# Patient Record
Sex: Male | Born: 1990 | Race: White | Hispanic: No | Marital: Single | Smoking: Never smoker
Health system: Southern US, Community
[De-identification: ages and names within clinical notes are randomized; demographics above are authoritative.]

## PROBLEM LIST (undated history)

## (undated) DIAGNOSIS — R51 Headache: Secondary | ICD-10-CM

## (undated) DIAGNOSIS — K589 Irritable bowel syndrome without diarrhea: Secondary | ICD-10-CM

## (undated) DIAGNOSIS — R519 Headache, unspecified: Secondary | ICD-10-CM

## (undated) DIAGNOSIS — F329 Major depressive disorder, single episode, unspecified: Secondary | ICD-10-CM

## (undated) DIAGNOSIS — F419 Anxiety disorder, unspecified: Secondary | ICD-10-CM

## (undated) DIAGNOSIS — F32A Depression, unspecified: Secondary | ICD-10-CM

## (undated) DIAGNOSIS — J309 Allergic rhinitis, unspecified: Secondary | ICD-10-CM

## (undated) HISTORY — DX: Major depressive disorder, single episode, unspecified: F32.9

## (undated) HISTORY — PX: WISDOM TOOTH EXTRACTION: SHX21

## (undated) HISTORY — DX: Allergic rhinitis, unspecified: J30.9

## (undated) HISTORY — PX: NASAL SINUS SURGERY: SHX719

## (undated) HISTORY — PX: TONSILLECTOMY: SUR1361

## (undated) HISTORY — PX: KNEE SURGERY: SHX244

## (undated) HISTORY — DX: Headache, unspecified: R51.9

## (undated) HISTORY — DX: Anxiety disorder, unspecified: F41.9

## (undated) HISTORY — DX: Headache: R51

## (undated) HISTORY — DX: Depression, unspecified: F32.A

## (undated) HISTORY — DX: Irritable bowel syndrome, unspecified: K58.9

---
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
## 2013-09-08 DIAGNOSIS — M7700 Medial epicondylitis, unspecified elbow: Secondary | ICD-10-CM | POA: Insufficient documentation

## 2013-09-08 DIAGNOSIS — M659 Synovitis and tenosynovitis, unspecified: Secondary | ICD-10-CM | POA: Insufficient documentation

## 2014-01-08 ENCOUNTER — Emergency Department (HOSPITAL_COMMUNITY)
Admission: EM | Admit: 2014-01-08 | Discharge: 2014-01-08 | Disposition: A | Discharge status: Home / Self Care | Payer: BC Managed Care – PPO | Attending: Emergency Medicine | Admitting: Emergency Medicine

## 2014-01-08 ENCOUNTER — Emergency Department (HOSPITAL_COMMUNITY): Payer: BC Managed Care – PPO

## 2014-01-08 ENCOUNTER — Encounter (HOSPITAL_COMMUNITY): Payer: Self-pay | Admitting: Radiology

## 2014-01-08 DIAGNOSIS — W1789XA Other fall from one level to another, initial encounter: Secondary | ICD-10-CM | POA: Diagnosis not present

## 2014-01-08 DIAGNOSIS — T1490XA Injury, unspecified, initial encounter: Secondary | ICD-10-CM

## 2014-01-08 DIAGNOSIS — Y998 Other external cause status: Secondary | ICD-10-CM | POA: Insufficient documentation

## 2014-01-08 DIAGNOSIS — W19XXXA Unspecified fall, initial encounter: Secondary | ICD-10-CM

## 2014-01-08 DIAGNOSIS — S22009A Unspecified fracture of unspecified thoracic vertebra, initial encounter for closed fracture: Secondary | ICD-10-CM

## 2014-01-08 DIAGNOSIS — S32010A Wedge compression fracture of first lumbar vertebra, initial encounter for closed fracture: Secondary | ICD-10-CM | POA: Diagnosis not present

## 2014-01-08 DIAGNOSIS — Y9289 Other specified places as the place of occurrence of the external cause: Secondary | ICD-10-CM | POA: Insufficient documentation

## 2014-01-08 DIAGNOSIS — S3992XA Unspecified injury of lower back, initial encounter: Secondary | ICD-10-CM | POA: Diagnosis present

## 2014-01-08 DIAGNOSIS — Y9339 Activity, other involving climbing, rappelling and jumping off: Secondary | ICD-10-CM | POA: Insufficient documentation

## 2014-01-08 DIAGNOSIS — Z79899 Other long term (current) drug therapy: Secondary | ICD-10-CM | POA: Insufficient documentation

## 2014-01-08 DIAGNOSIS — S22080A Wedge compression fracture of T11-T12 vertebra, initial encounter for closed fracture: Secondary | ICD-10-CM | POA: Insufficient documentation

## 2014-01-08 DIAGNOSIS — S32009A Unspecified fracture of unspecified lumbar vertebra, initial encounter for closed fracture: Secondary | ICD-10-CM

## 2014-01-08 LAB — CBC
HEMATOCRIT: 44 % (ref 39.0–52.0)
HEMOGLOBIN: 15.3 g/dL (ref 13.0–17.0)
MCH: 28.3 pg (ref 26.0–34.0)
MCHC: 34.8 g/dL (ref 30.0–36.0)
MCV: 81.3 fL (ref 78.0–100.0)
PLATELETS: 249 10*3/uL (ref 150–400)
RBC: 5.41 MIL/uL (ref 4.22–5.81)
RDW: 12.9 % (ref 11.5–15.5)
WBC: 6.2 10*3/uL (ref 4.0–10.5)

## 2014-01-08 LAB — COMPREHENSIVE METABOLIC PANEL
ALK PHOS: 77 U/L (ref 39–117)
ALT: 26 U/L (ref 0–53)
ANION GAP: 7 (ref 5–15)
AST: 41 U/L — AB (ref 0–37)
Albumin: 4.4 g/dL (ref 3.5–5.2)
BILIRUBIN TOTAL: 0.9 mg/dL (ref 0.3–1.2)
BUN: 13 mg/dL (ref 6–23)
CALCIUM: 10.1 mg/dL (ref 8.4–10.5)
CO2: 27 mmol/L (ref 19–32)
Chloride: 104 mEq/L (ref 96–112)
Creatinine, Ser: 1.23 mg/dL (ref 0.50–1.35)
GFR, EST NON AFRICAN AMERICAN: 82 mL/min — AB (ref >90)
Glucose, Bld: 102 mg/dL — ABNORMAL HIGH (ref 70–99)
POTASSIUM: 4 mmol/L (ref 3.5–5.1)
Sodium: 138 mmol/L (ref 135–145)
Total Protein: 7.1 g/dL (ref 6.0–8.3)

## 2014-01-08 LAB — PROTIME-INR
INR: 1.06 (ref 0.00–1.49)
Prothrombin Time: 13.9 seconds (ref 11.6–15.2)

## 2014-01-08 LAB — ETHANOL: Alcohol, Ethyl (B): <5 mg/dL (ref 0–9)

## 2014-01-08 LAB — CDS SEROLOGY

## 2014-01-08 LAB — SAMPLE TO BLOOD BANK

## 2014-01-08 IMAGING — CT CT T SPINE W/O CM
2 of 3 series · 11 of 33 positions shown, 13 images · non-contrast
Comparison: none

CLINICAL DATA: Fell 20 feet while rock climbing.

EXAM:
CT ABDOMEN AND PELVIS WITHOUT CONTRAST, CT THORACIC SPINE, CT LUMBAR
SPINE.
TECHNIQUE: Multidetector CT imaging of the abdomen and pelvis was performed
following the standard protocol without IV contrast. CT thoracic and
lumbar spines were generated from the abdominal/pelvic imaging.

[Series 3: t-spine 2.0 i30s 3 · axial · 0.26mm/px · z∈[+988,+1282]mm · 8 of 175 slices shown, 10 images]
[im 14/175  soft-tissue]
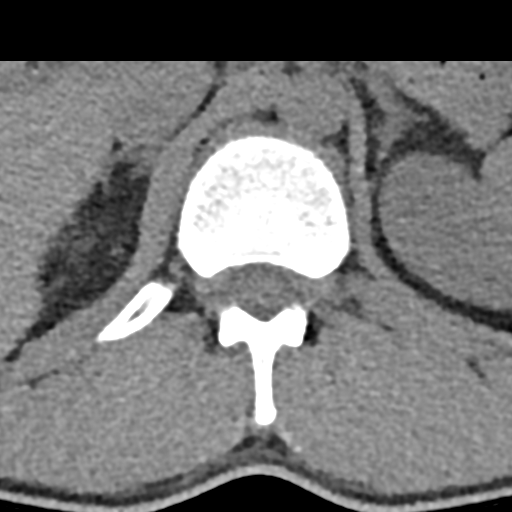
[im 14/175  bone]
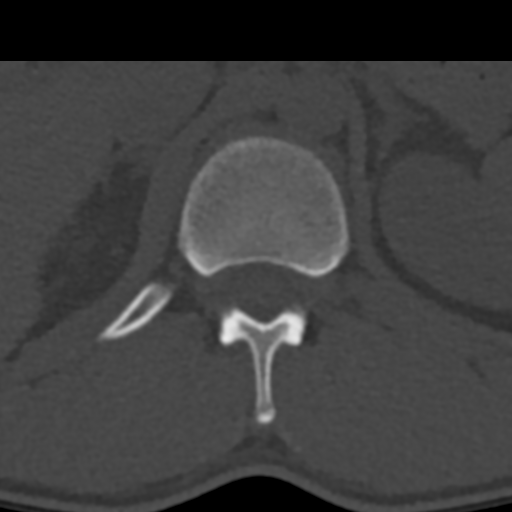
[im 41/175  bone]
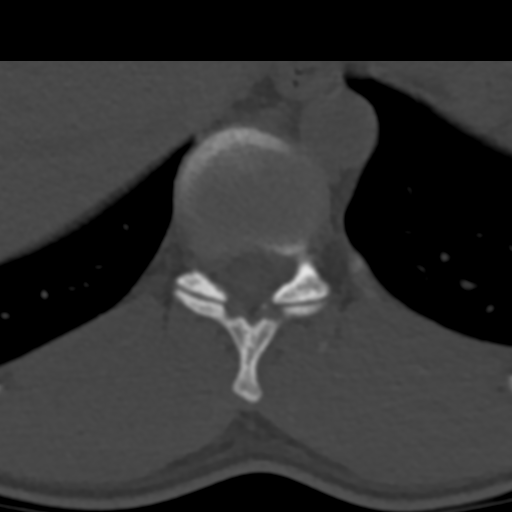
[im 54/175  bone]
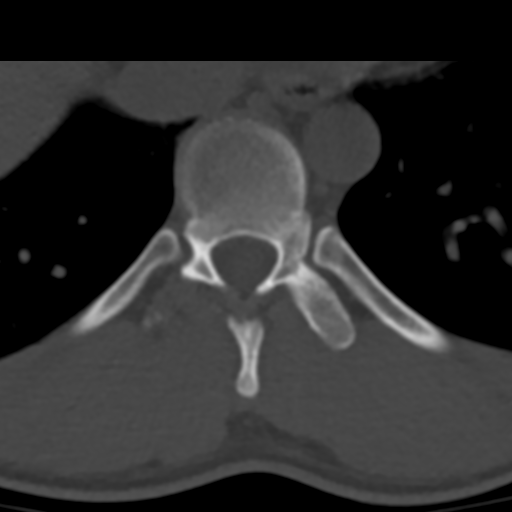
[im 81/175  bone]
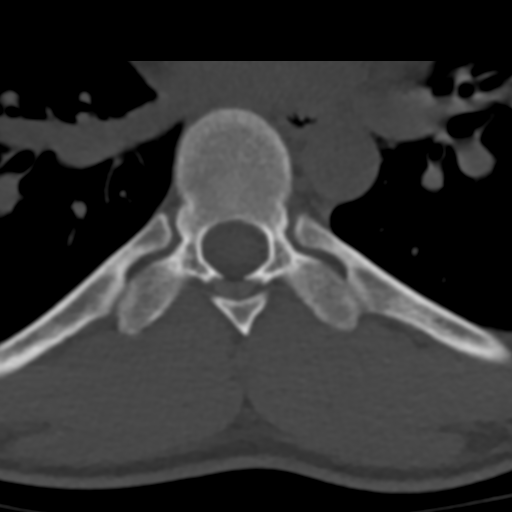
[im 94/175  soft-tissue]
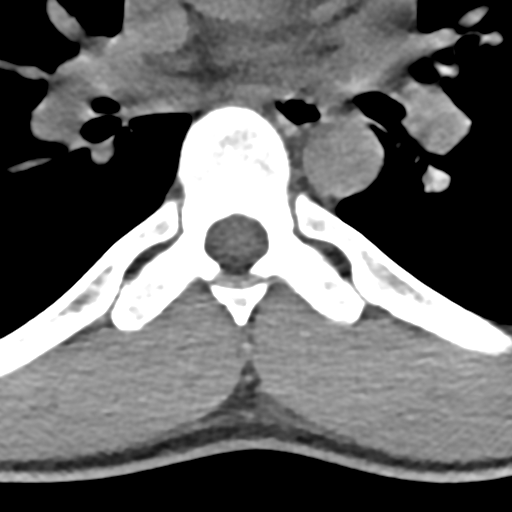
[im 94/175  bone]
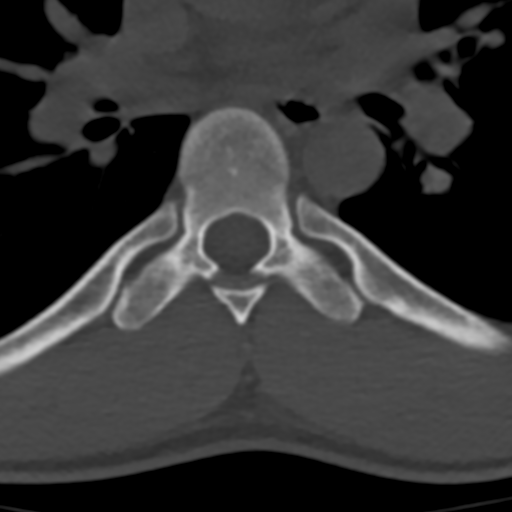
[im 121/175  bone]
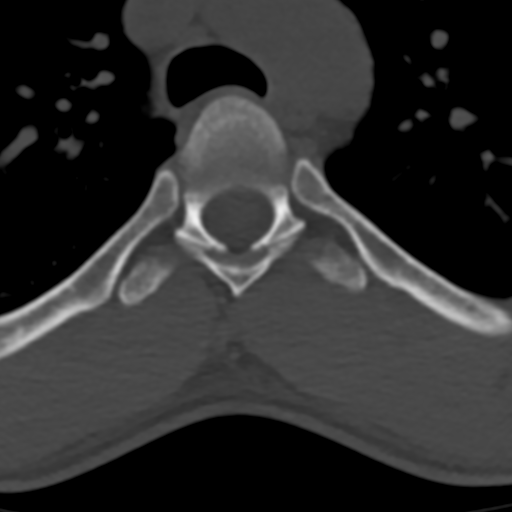
[im 134/175  bone]
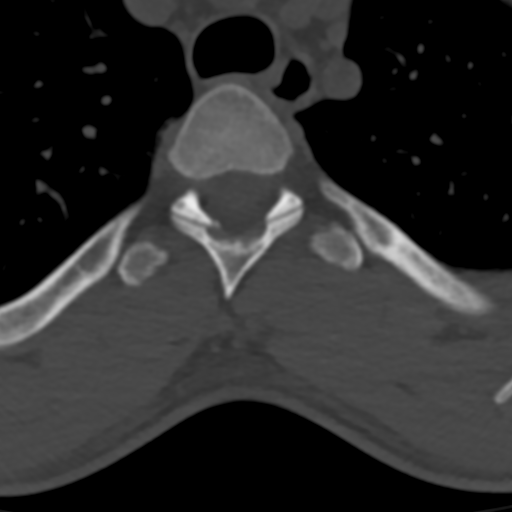
[im 161/175  bone]
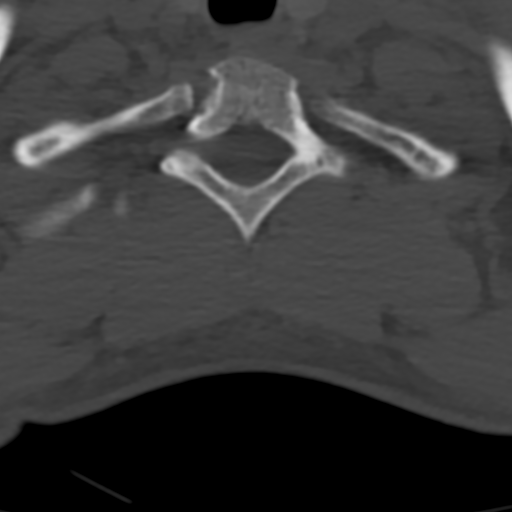

[Series 6: coronal st · coronal · 0.25mm/px · 3 of 67 slices shown]
[im 14/67  bone]
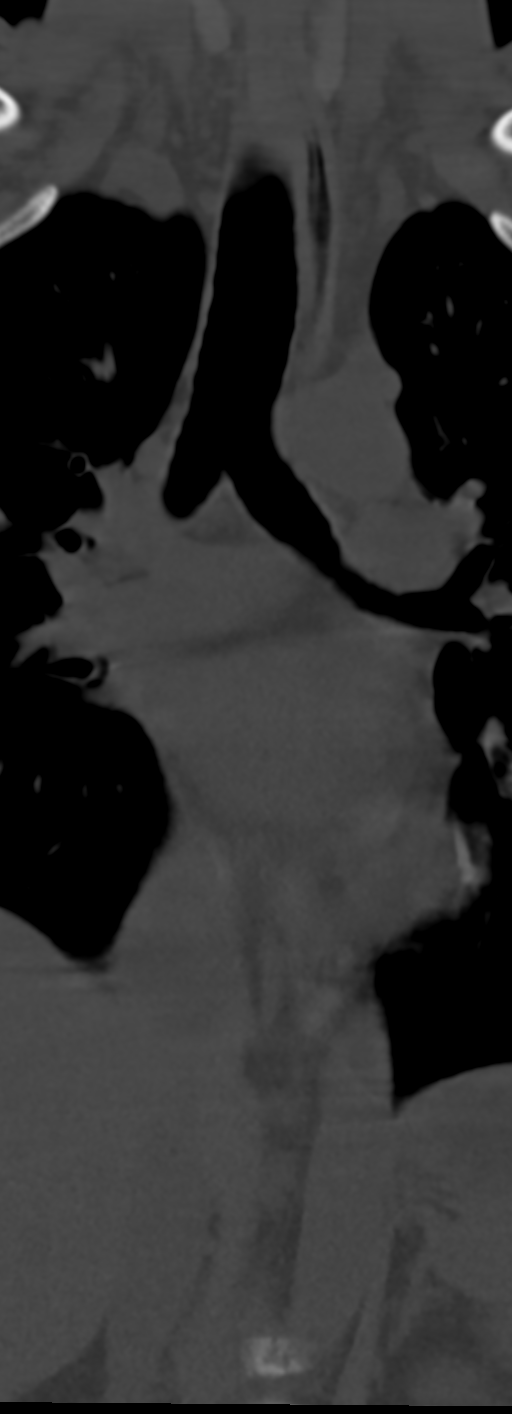
[im 27/67  bone]
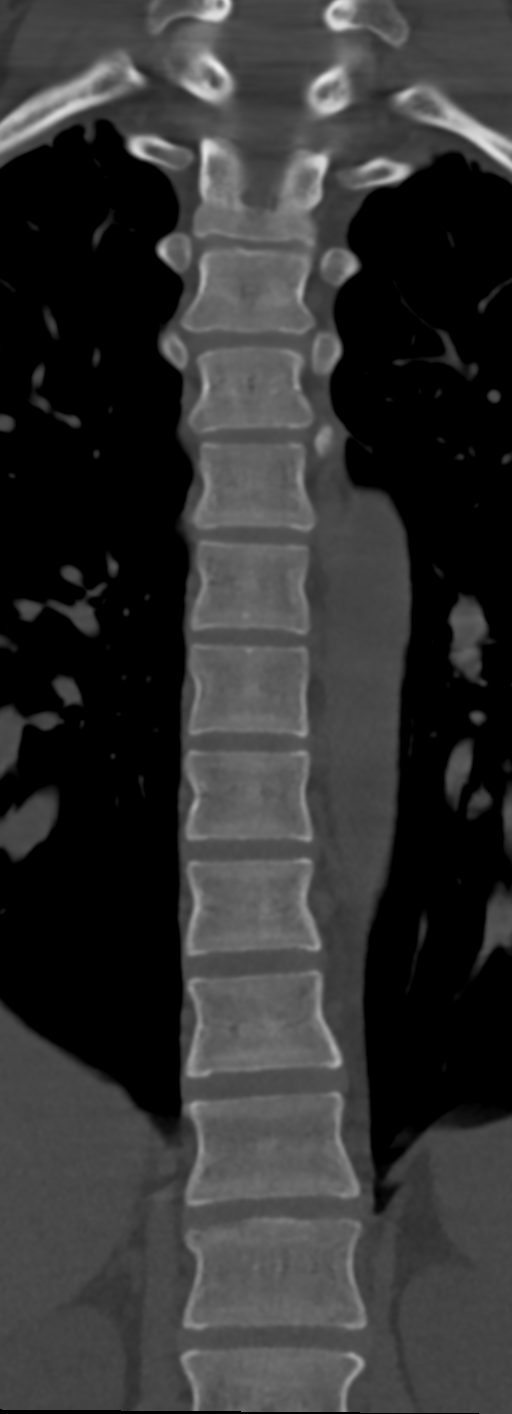
[im 40/67  bone]
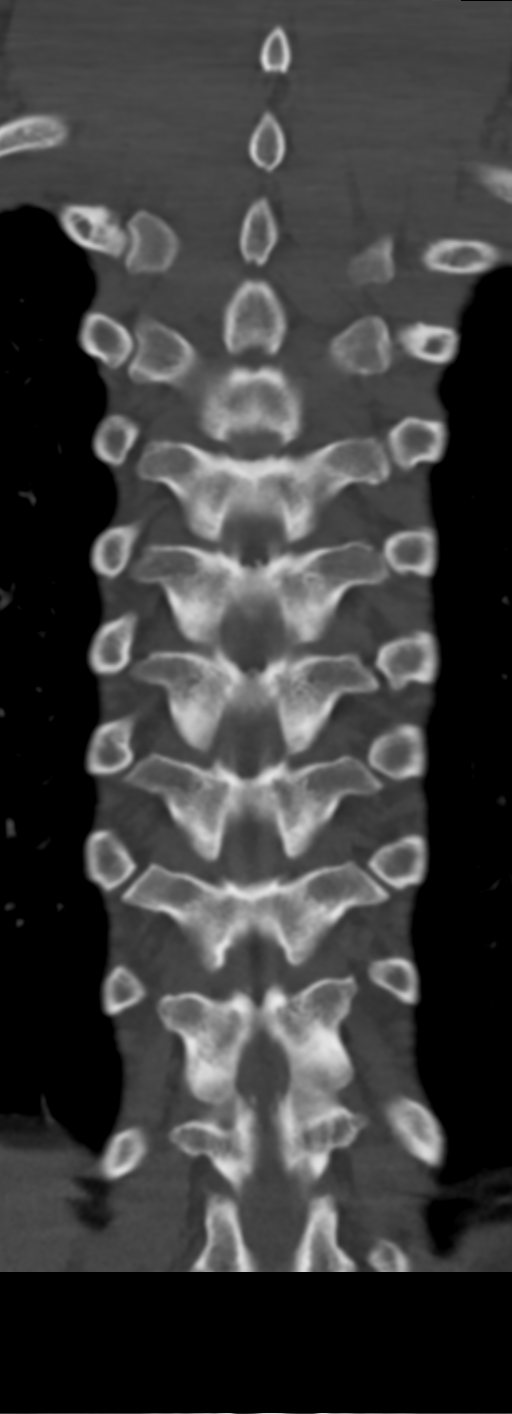

[11 of 33 positions shown; findings below may reference images not displayed]

FINDINGS: CT abdomen:

The lung bases are clear. No pleural effusion. No pneumothorax. The
lower ribs are intact.

The solid abdominal organs are intact. No acute injury. The
gallbladder is normal. No common bile duct dilatation.

The stomach, duodenum, small bowel and colon are grossly normal
without oral contrast. No inflammatory changes, mass lesions or
obstructive findings.

The aorta and branch vessels are normal. No mesenteric or
retroperitoneal mass, adenopathy or hematoma. The major venous
structures are patent. A circumaortic left renal vein is noted.

Pelvis: The bladder, prostate gland and seminal vesicles are normal.
No pelvic mass, adenopathy or hematoma. No inguinal mass or
hematoma.

The bony pelvis is intact. The pubic symphysis and SI joints appear
normal. Both hips are normally located.

CT thoracic spine:

Normal alignment of the thoracic vertebral bodies. The facets are
normally aligned. The spinal canal is patent. There is a compression
fracture of the T12 vertebral body. No retropulsion or canal
compromise. The pedicles and posterior elements are intact.

CT lumbar spine:

There is a compression fracture of the L1 vertebral body with mild
compression but no retropulsion or canal compromise. The pedicles
and posterior elements are intact. The lumbar vertebral bodies are
normally aligned. No other fractures. The spinous processes are
intact.
IMPRESSION: 1. Anterior wedge like compression fractures involving T12 and L1
but no retropulsion, canal compromise or posterior element
involvement.
2. No significant intra abdominal/pelvic findings. The bony pelvis
is intact.

## 2014-01-08 MED ORDER — HYDROMORPHONE HCL 1 MG/ML IJ SOLN
1.0000 mg | Freq: Once | INTRAMUSCULAR | Status: AC
  Administered 2014-01-08: 1 mg via INTRAVENOUS
  Filled 2014-01-08: qty 1

## 2014-01-08 MED ORDER — LORAZEPAM 2 MG/ML IJ SOLN
1.0000 mg | Freq: Once | INTRAMUSCULAR | Status: AC
  Administered 2014-01-08: 1 mg via INTRAVENOUS
  Filled 2014-01-08: qty 1

## 2014-01-08 MED ORDER — IOHEXOL 350 MG/ML SOLN: 100.0000 mL | Freq: Once | INTRAVENOUS | Status: DC | PRN

## 2014-01-08 MED ORDER — HYDROMORPHONE HCL 1 MG/ML IJ SOLN
1.0000 mg | Freq: Once | INTRAMUSCULAR | Status: AC
Start: 2014-01-08 — End: 2014-01-08
  Administered 2014-01-08: 1 mg via INTRAVENOUS
  Filled 2014-01-08: qty 1

## 2014-01-08 MED ORDER — OXYCODONE-ACETAMINOPHEN 5-325 MG PO TABS: 1.0000 | ORAL_TABLET | ORAL | Status: DC | PRN

## 2014-01-08 MED ORDER — IOHEXOL 300 MG/ML  SOLN
100.0000 mL | Freq: Once | INTRAMUSCULAR | Status: AC | PRN
  Administered 2014-01-08: 100 mL via INTRAVENOUS

## 2014-01-08 MED ORDER — SODIUM CHLORIDE 0.9 % IV BOLUS (SEPSIS): 1000.0000 mL | Freq: Once | INTRAVENOUS | Status: DC

## 2014-01-08 MED ORDER — SODIUM CHLORIDE 0.9 % IV BOLUS (SEPSIS)
1000.0000 mL | Freq: Once | INTRAVENOUS | Status: AC
  Administered 2014-01-08: 1000 mL via INTRAVENOUS

## 2014-01-08 NOTE — ED Notes (Signed)
TLSO brace  Being applied by tech; pt c/o increased pain since log rolled onto side.

## 2014-01-08 NOTE — ED Notes (Signed)
Paged ortho tech to place TLSO brace. Tech advised this RN that device comes from out of facility, so it may take approx 2 hours before device can be placed.

## 2014-01-08 NOTE — ED Notes (Signed)
Radiology aware that patient has brace in place and is ready for xray

## 2014-01-08 NOTE — ED Provider Notes (Signed)
CSN: [id number]     Arrival date & time 01/08/14  1540 History   First MD Initiated Contact with Patient 01/08/14 1546     Chief Complaint  Patient presents with  . Fall     (Consider location/radiation/quality/duration/timing/severity/associated sxs/prior Treatment) Patient is a 24 y.o. male presenting with fall. The history is provided by the patient and the EMS personnel.  Fall This is a new problem. The current episode started today (30 minutes prior to presentation). The problem has been unchanged. Associated symptoms include arthralgias. Pertinent negatives include no abdominal pain, chest pain, diaphoresis, fever, headaches, myalgias, nausea, neck pain, rash, sore throat, vomiting or weakness. Nothing aggravates the symptoms. He has tried nothing for the symptoms.    History reviewed. No pertinent past medical history. No past surgical history on file. No family history on file. History  Substance Use Topics  . Smoking status: Not on file  . Smokeless tobacco: Not on file  . Alcohol Use: Not on file    Review of Systems  Constitutional: Negative for fever, diaphoresis, activity change and appetite change.  HENT: Negative for facial swelling, sore throat, tinnitus, trouble swallowing and voice change.   Eyes: Negative for pain, redness and visual disturbance.  Respiratory: Negative for chest tightness, shortness of breath and wheezing.   Cardiovascular: Negative for chest pain, palpitations and leg swelling.  Gastrointestinal: Negative for nausea, vomiting, abdominal pain, diarrhea, constipation and abdominal distention.  Endocrine: Negative.   Genitourinary: Negative.  Negative for dysuria, decreased urine volume, scrotal swelling and testicular pain.  Musculoskeletal: Positive for back pain and arthralgias. Negative for myalgias, gait problem, neck pain and neck stiffness.  Skin: Negative.  Negative for rash.  Neurological: Negative.  Negative for dizziness, tremors,  weakness and headaches.  Psychiatric/Behavioral: Negative for suicidal ideas, hallucinations and self-injury. The patient is not nervous/anxious.       Allergies  Review of patient's allergies indicates no known allergies.  Home Medications   Prior to Admission medications   Medication Sig Start Date End Date Taking? Authorizing Provider  ALPRAZolam Prudy Feeler) 1 MG tablet Take 1 mg by mouth at bedtime as needed for anxiety.   Yes Historical Provider, MD  azelastine (ASTELIN) 0.1 % nasal spray Place 1 spray into both nostrils daily as needed for rhinitis. Use in each nostril as directed   Yes Historical Provider, MD  montelukast (SINGULAIR) 10 MG tablet Take 10 mg by mouth at bedtime.   Yes Historical Provider, MD  oxyCODONE-acetaminophen (PERCOCET/ROXICET) 5-325 MG per tablet Take 1 tablet by mouth every 4 (four) hours as needed for moderate pain or severe pain. 01/08/14   Lula Olszewski, MD   BP 135/82 mmHg  Pulse 99  Temp(Src) 98.1 F (36.7 C) (Oral)  Resp 16  Ht 5\' 8"  (1.727 m)  Wt 165 lb (74.844 kg)  BMI 25.09 kg/m2  SpO2 100% Physical Exam  Constitutional: He is oriented to person, place, and time. He appears well-developed and well-nourished. No distress.  HENT:  Head: Normocephalic and atraumatic.  Right Ear: External ear normal.  Left Ear: External ear normal.  Nose: Nose normal.  Mouth/Throat: Oropharynx is clear and moist.  Eyes: Conjunctivae and EOM are normal. Pupils are equal, round, and reactive to light. No scleral icterus.  Neck: Normal range of motion. Neck supple. No JVD present. No tracheal deviation present. No thyromegaly present.  No neck tenderness  Cardiovascular: Normal rate and intact distal pulses.  Exam reveals no gallop and no friction rub.   No  murmur heard. Pulmonary/Chest: Effort normal and breath sounds normal. No stridor. No respiratory distress. He has no wheezes. He has no rales.  Abdominal: Soft. He exhibits no distension. There is no tenderness.  There is no rebound and no guarding.  Musculoskeletal: Normal range of motion. He exhibits tenderness (lower thoracic and upper lumbar midline tenderness with no boney step offs or deformitys. Bilateral acetabulum tenderness). He exhibits no edema.  Neurological: He is alert and oriented to person, place, and time. No cranial nerve deficit. He exhibits normal muscle tone. Coordination normal.  GCS 15. Patient NV intact in all extremities moving all extremities spontaneously.   Skin: Skin is warm and dry. No rash noted. He is not diaphoretic.  Psychiatric: He has a normal mood and affect. His behavior is normal.  Nursing note and vitals reviewed.   ED Course  Procedures (including critical care time) Labs Review Labs Reviewed  COMPREHENSIVE METABOLIC PANEL - Abnormal; Notable for the following:    Glucose, Bld 102 (*)    AST 41 (*)    GFR calc non Af Amer 82 (*)    All other components within normal limits  CDS SEROLOGY  CBC  ETHANOL  PROTIME-INR  SAMPLE TO BLOOD BANK    Imaging Review Dg Thoracolumabar Spine  01/08/2014   CLINICAL DATA:  Thoracic vertebral fracture. Patient free fell wall rock climbing an landed on back.  EXAM: THORACOLUMBAR SPINE - 2 VIEW  COMPARISON:  CT lumbar spine and thoracic spine 01/08/2014  FINDINGS: Study is technically limited due to artifact from back board. Localization/ measurement marker is positioned on the images.  As demonstrated on previous CT images, there is anterior compression of the T12 and L1 vertebral bodies with involvement of the superior endplates at both levels. Approximately 30% loss of vertebral height anteriorly at both levels. Normal alignment of the thoracic and lumbar spine with mild kyphosis at T12/L1 level. No radiographic evidence of retropulsion of fracture fragments.  IMPRESSION: Anterior compression fractures involving the superior endplates of T12 and L1 vertebrae.   Electronically Signed   By: Burman Nieves M.D.   On:  01/08/2014 21:49   Ct Thoracic Spine Wo Contrast  01/08/2014   CLINICAL DATA:  Larey Seat 20 feet while rock climbing.  EXAM: CT ABDOMEN AND PELVIS WITHOUT CONTRAST, CT THORACIC SPINE, CT LUMBAR SPINE.  TECHNIQUE: Multidetector CT imaging of the abdomen and pelvis was performed following the standard protocol without IV contrast. CT thoracic and lumbar spines were generated from the abdominal/pelvic imaging.  FINDINGS: CT abdomen:  The lung bases are clear. No pleural effusion. No pneumothorax. The lower ribs are intact.  The solid abdominal organs are intact. No acute injury. The gallbladder is normal. No common bile duct dilatation.  The stomach, duodenum, small bowel and colon are grossly normal without oral contrast. No inflammatory changes, mass lesions or obstructive findings.  The aorta and branch vessels are normal. No mesenteric or retroperitoneal mass, adenopathy or hematoma. The major venous structures are patent. A circumaortic left renal vein is noted.  Pelvis: The bladder, prostate gland and seminal vesicles are normal. No pelvic mass, adenopathy or hematoma. No inguinal mass or hematoma.  The bony pelvis is intact. The pubic symphysis and SI joints appear normal. Both hips are normally located.  CT thoracic spine:  Normal alignment of the thoracic vertebral bodies. The facets are normally aligned. The spinal canal is patent. There is a compression fracture of the T12 vertebral body. No retropulsion or canal compromise. The pedicles  and posterior elements are intact.  CT lumbar spine:  There is a compression fracture of the L1 vertebral body with mild compression but no retropulsion or canal compromise. The pedicles and posterior elements are intact. The lumbar vertebral bodies are normally aligned. No other fractures. The spinous processes are intact.  IMPRESSION: 1. Anterior wedge like compression fractures involving T12 and L1 but no retropulsion, canal compromise or posterior element involvement. 2. No  significant intra abdominal/pelvic findings. The bony pelvis is intact.   Electronically Signed   By: Loralie Champagne M.D.   On: 01/08/2014 17:32   Ct Lumbar Spine Wo Contrast  01/08/2014   CLINICAL DATA:  Larey Seat 20 feet while rock climbing.  EXAM: CT ABDOMEN AND PELVIS WITHOUT CONTRAST, CT THORACIC SPINE, CT LUMBAR SPINE.  TECHNIQUE: Multidetector CT imaging of the abdomen and pelvis was performed following the standard protocol without IV contrast. CT thoracic and lumbar spines were generated from the abdominal/pelvic imaging.  FINDINGS: CT abdomen:  The lung bases are clear. No pleural effusion. No pneumothorax. The lower ribs are intact.  The solid abdominal organs are intact. No acute injury. The gallbladder is normal. No common bile duct dilatation.  The stomach, duodenum, small bowel and colon are grossly normal without oral contrast. No inflammatory changes, mass lesions or obstructive findings.  The aorta and branch vessels are normal. No mesenteric or retroperitoneal mass, adenopathy or hematoma. The major venous structures are patent. A circumaortic left renal vein is noted.  Pelvis: The bladder, prostate gland and seminal vesicles are normal. No pelvic mass, adenopathy or hematoma. No inguinal mass or hematoma.  The bony pelvis is intact. The pubic symphysis and SI joints appear normal. Both hips are normally located.  CT thoracic spine:  Normal alignment of the thoracic vertebral bodies. The facets are normally aligned. The spinal canal is patent. There is a compression fracture of the T12 vertebral body. No retropulsion or canal compromise. The pedicles and posterior elements are intact.  CT lumbar spine:  There is a compression fracture of the L1 vertebral body with mild compression but no retropulsion or canal compromise. The pedicles and posterior elements are intact. The lumbar vertebral bodies are normally aligned. No other fractures. The spinous processes are intact.  IMPRESSION: 1. Anterior  wedge like compression fractures involving T12 and L1 but no retropulsion, canal compromise or posterior element involvement. 2. No significant intra abdominal/pelvic findings. The bony pelvis is intact.   Electronically Signed   By: Loralie Champagne M.D.   On: 01/08/2014 17:32   Ct Abdomen Pelvis W Contrast  01/08/2014   CLINICAL DATA:  Larey Seat 20 feet while rock climbing.  EXAM: CT ABDOMEN AND PELVIS WITHOUT CONTRAST, CT THORACIC SPINE, CT LUMBAR SPINE.  TECHNIQUE: Multidetector CT imaging of the abdomen and pelvis was performed following the standard protocol without IV contrast. CT thoracic and lumbar spines were generated from the abdominal/pelvic imaging.  FINDINGS: CT abdomen:  The lung bases are clear. No pleural effusion. No pneumothorax. The lower ribs are intact.  The solid abdominal organs are intact. No acute injury. The gallbladder is normal. No common bile duct dilatation.  The stomach, duodenum, small bowel and colon are grossly normal without oral contrast. No inflammatory changes, mass lesions or obstructive findings.  The aorta and branch vessels are normal. No mesenteric or retroperitoneal mass, adenopathy or hematoma. The major venous structures are patent. A circumaortic left renal vein is noted.  Pelvis: The bladder, prostate gland and seminal vesicles are normal. No  pelvic mass, adenopathy or hematoma. No inguinal mass or hematoma.  The bony pelvis is intact. The pubic symphysis and SI joints appear normal. Both hips are normally located.  CT thoracic spine:  Normal alignment of the thoracic vertebral bodies. The facets are normally aligned. The spinal canal is patent. There is a compression fracture of the T12 vertebral body. No retropulsion or canal compromise. The pedicles and posterior elements are intact.  CT lumbar spine:  There is a compression fracture of the L1 vertebral body with mild compression but no retropulsion or canal compromise. The pedicles and posterior elements are intact.  The lumbar vertebral bodies are normally aligned. No other fractures. The spinous processes are intact.  IMPRESSION: 1. Anterior wedge like compression fractures involving T12 and L1 but no retropulsion, canal compromise or posterior element involvement. 2. No significant intra abdominal/pelvic findings. The bony pelvis is intact.   Electronically Signed   By: Loralie Champagne M.D.   On: 01/08/2014 17:32   Dg Pelvis Portable  01/08/2014   CLINICAL DATA:  fell while climbing a rock, pt fell onto bottom  EXAM: PORTABLE PELVIS 1-2 VIEWS  COMPARISON:  None.  FINDINGS: There is no evidence of pelvic fracture or diastasis. No pelvic bone lesions are seen.  IMPRESSION: Negative.   Electronically Signed   By: Oley Balm M.D.   On: 01/08/2014 16:04   Dg Chest Portable 1 View  01/08/2014   CLINICAL DATA:  fell while climbing a rock, pt fell onto bottom  EXAM: PORTABLE CHEST - 1 VIEW  COMPARISON:  None available  FINDINGS: Lungs are clear. Heart size and mediastinal contours are within normal limits. No effusion. Visualized skeletal structures are unremarkable.  IMPRESSION: No acute cardiopulmonary disease.   Electronically Signed   By: Oley Balm M.D.   On: 01/08/2014 16:03     EKG Interpretation   Date/Time:  Saturday January 08 2014 15:47:02 EST Ventricular Rate:  100 PR Interval:  122 QRS Duration: 92 QT Interval:  323 QTC Calculation: 416 R Axis:   70 Text Interpretation:  Sinus tachycardia early repolarization Confirmed by  ZAVITZ  MD, JOSHUA ([id number]) on 01/08/2014 3:51:13 PM      MDM   Final diagnoses:  Fall  Thoracic vertebral fracture  Lumbar vertebral fracture    The patient is a 23 y.o. M who presents after falling 18 feet while rock climbing indoors and landing on his buttocks. Patient did not strike his head or neck at any point and denies any loc, emesis, headache or neck pan. Complains of lower back pain. Patient AFVSS NVI throughout. C-spine cleared via Nexus criteria. CTs  show nondisplaced T12-L1 fracture. Case is discussed with Dr. Wynetta Emery from neurosurgery. No acute neurosurgery intervention indicated. Patient fitted with TLSO brace and uprights obtained. Patient discharged with pain control and Neurosurgery followed. Patient expresses understanding and agreement with this plan and is discharged home with standard ED return precautions.  Patient seen with attending, Dr. Jodi Mourning, who oversaw clinical decision making.     Lula Olszewski, MD 01/08/14 [id number]  Enid Skeens, MD 01/09/14 Marlyne Beards

## 2014-01-08 NOTE — ED Notes (Signed)
Pt in from [employer], pt fell while climbing a rock, pt fell onto bottom, -LOC, c/o bil mid back, moves all extremities, A&O x4, follows commands, speaks in complete sentences, no obvious deformities, pt in LSB & C collar upon arrival to ED

## 2014-01-08 NOTE — ED Notes (Signed)
Pt did not leave for xray. Xray will be completed when brace in place

## 2014-01-08 NOTE — ED Notes (Signed)
Dr. Weyman Pedro cleared pt's C collar and removed the device. MD verbal order allows pt to have ice water and sit up 15 degrees.

## 2014-01-08 NOTE — Progress Notes (Signed)
Chaplain responded to level 2 trauma. Met patient's girlfriend De Blanch) and two other friends in the ED waiting room. Girlfriend was teary and anxious.  Escorted family to bedside, oriented family to area and offered support. Please call as needed.   Luana Shu [phone]   01/08/14 1900  Clinical Encounter Type  Visited With Patient and family together  Visit Type Social support  Referral From (Trauma level 2)

## 2014-01-08 NOTE — Progress Notes (Signed)
Orthopedic Tech Progress Note Patient Details:  Tery Hoeger 09/30/1990 [id number] Called order in to [facility]. Patient ID: Christiaan Strebeck, male   DOB: 1990/03/15, 24 y.o.   MRN: [id number]   Lesle Chris 01/08/2014, 7:06 PM

## 2014-01-08 NOTE — ED Notes (Signed)
Informed pt that C collar will remain in place until imaging has resulted. MD will go over results. MD aware of pt's pain.

## 2014-01-08 NOTE — ED Notes (Signed)
Dr. Zavitz at the bedside.  

## 2014-07-01 ENCOUNTER — Other Ambulatory Visit: Payer: Self-pay | Admitting: Neurosurgery

## 2014-07-01 DIAGNOSIS — M5126 Other intervertebral disc displacement, lumbar region: Secondary | ICD-10-CM

## 2014-07-05 ENCOUNTER — Ambulatory Visit
Admission: RE | Admit: 2014-07-05 | Discharge: 2014-07-05 | Disposition: A | Discharge status: Home / Self Care | Payer: Managed Care, Other (non HMO) | Source: Ambulatory Visit | Attending: Neurosurgery | Admitting: Neurosurgery

## 2014-07-05 DIAGNOSIS — M5126 Other intervertebral disc displacement, lumbar region: Secondary | ICD-10-CM

## 2014-07-05 MED ORDER — METHYLPREDNISOLONE ACETATE 40 MG/ML INJ SUSP (RADIOLOG
120.0000 mg | Freq: Once | INTRAMUSCULAR | Status: AC
  Administered 2014-07-05: 120 mg via EPIDURAL

## 2014-07-05 MED ORDER — IOHEXOL 180 MG/ML  SOLN
1.0000 mL | Freq: Once | INTRAMUSCULAR | Status: AC | PRN
  Administered 2014-07-05: 1 mL via EPIDURAL

## 2014-07-05 NOTE — Discharge Instructions (Signed)

## 2014-08-22 ENCOUNTER — Encounter: Payer: Self-pay | Admitting: *Deleted

## 2014-08-22 ENCOUNTER — Ambulatory Visit (INDEPENDENT_AMBULATORY_CARE_PROVIDER_SITE_OTHER): Payer: Managed Care, Other (non HMO) | Admitting: Diagnostic Neuroimaging

## 2014-08-22 VITALS — BP 111/72 | HR 84 | Ht 68.0 in | Wt 159.0 lb

## 2014-08-22 DIAGNOSIS — H539 Unspecified visual disturbance: Secondary | ICD-10-CM | POA: Diagnosis not present

## 2014-08-22 DIAGNOSIS — G43109 Migraine with aura, not intractable, without status migrainosus: Secondary | ICD-10-CM

## 2014-08-22 NOTE — Patient Instructions (Signed)
I will check MRI brain.  

## 2014-08-22 NOTE — Progress Notes (Signed)
[facility]  PATIENT: Dennis Gordon DOB: [birth year]  REFERRING CLINICIAN: Geanie Kenning, PA-c HISTORY FROM: patient  REASON FOR VISIT: new consult    HISTORICAL  CHIEF COMPLAINT:  Chief Complaint  Patient presents with  . Visual loss, headaches    rm 7, New Patient    HISTORY OF PRESENT ILLNESS:   24 year old right-handed male here for evaluation of visual change, visual loss. 08/13/14 patient was driving home when all of a sudden he noticed a blurry spot in his right visual field that began growing in size. He says it was like looking through wax paper. There is a bright rim/average to this spot which was jagged and irregular. Over 10 minutes this grew large enough that he had difficulty driving and therefore pulled over. He called a friend for help and they contemplated going to the emergency room. Within 30 minutes of onset the symptoms had resolved. No further symptoms that day. Potential triggering factors include some dehydration and overheating at work the day before.  Over the next few days patient has had intermittent daily headaches, temporal pressure, lasting hours at a time and will controlled with ibuprofen. No nausea or vomiting photophobia or phonophobia.  Patient has history of other types of headaches at age 75-40 years old, possibly migraine, where they were severe, associated with photophobia and missing school. He was treated with Imitrex for a few doses and then these headaches stopped.  On 01/08/2014 patient was rockclimbing indoors, when his partner got tangled in the belay rope, and patient then fell 20 feet onto his bottom. He went to the emergency room as precaution and was complaining mainly of body aches and pains. He had some memory loss over the next few days. He had some compression fractures of T12 and L1 which were managed conservatively.   REVIEW OF SYSTEMS: Full 14 system review of systems performed and notable only for headache dizziness  I pain loss of vision blurred vision fatigue.  ALLERGIES: No Known Allergies  HOME MEDICATIONS: Outpatient Prescriptions Prior to Visit  Medication Sig Dispense Refill  . ALPRAZolam (XANAX) 1 MG tablet Take 1 mg by mouth at bedtime as needed for anxiety.    Marland Kitchen azelastine (ASTELIN) 0.1 % nasal spray Place 1 spray into both nostrils daily as needed for rhinitis. Use in each nostril as directed    . meloxicam (MOBIC) 15 MG tablet Take 15 mg by mouth.    . montelukast (SINGULAIR) 10 MG tablet Take 10 mg by mouth at bedtime.    Marland Kitchen oxyCODONE-acetaminophen (PERCOCET/ROXICET) 5-325 MG per tablet Take 1 tablet by mouth every 4 (four) hours as needed for moderate pain or severe pain. 30 tablet 0   No facility-administered medications prior to visit.    PAST MEDICAL HISTORY: Past Medical History  Diagnosis Date  . Irritable bowel syndrome   . Allergic rhinitis   . Depression   . Anxiety   . Headache     migraines age 89-16    PAST SURGICAL HISTORY: Past Surgical History  Procedure Laterality Date  . Nasal sinus surgery    . Tonsillectomy    . Knee surgery    . Wisdom tooth extraction      FAMILY HISTORY: History reviewed. No pertinent family history.  SOCIAL HISTORY:  Social History   Social History  . Marital Status: Single    Spouse Name: N/A  . Number of Children: 0  . Years of Education: 16   Occupational History  .  power Scientist, physiological   Social History Main Topics  . Smoking status: Never Smoker   . Smokeless tobacco: Not on file  . Alcohol Use: 0.0 oz/week    0 Standard drinks or equivalent per week     Comment: socially  . Drug Use: No  . Sexual Activity: Not on file   Other Topics Concern  . Not on file   Social History Narrative   Lives alone   Caffeine use- coffee "all day"     PHYSICAL EXAM  GENERAL EXAM/CONSTITUTIONAL: Vitals:  Filed Vitals:   08/22/14 0910  BP: 111/72  Pulse: 84  Height: 5\' 8"  (1.727 m)  Weight: 159 lb (72.122 kg)      Body mass index is 24.18 kg/(m^2).  Visual Acuity Screening   Right eye Left eye Both eyes  Without correction: 20/20 20/20   With correction:        Patient is in no distress; well developed, nourished and groomed; neck is supple  CARDIOVASCULAR:  Examination of carotid arteries is normal; no carotid bruits  Regular rate and rhythm, no murmurs  Examination of peripheral vascular system by observation and palpation is normal  EYES:  Ophthalmoscopic exam of optic discs and posterior segments is normal; no papilledema or hemorrhages  MUSCULOSKELETAL:  Gait, strength, tone, movements noted in Neurologic exam below  NEUROLOGIC: MENTAL STATUS:  No flowsheet data found.  awake, alert, oriented to person, place and time  recent and remote memory intact  normal attention and concentration  language fluent, comprehension intact, naming intact,   fund of knowledge appropriate  CRANIAL NERVE:   2nd - no papilledema on fundoscopic exam  2nd, 3rd, 4th, 6th - pupils equal and reactive to light, visual fields full to confrontation, extraocular muscles intact, no nystagmus  5th - facial sensation symmetric  7th - facial strength symmetric  8th - hearing intact  9th - palate elevates symmetrically, uvula midline  11th - shoulder shrug symmetric  12th - tongue protrusion midline  MOTOR:   normal bulk and tone, full strength in the BUE, BLE  SENSORY:   normal and symmetric to light touch, pinprick, temperature, vibration  COORDINATION:   finger-nose-finger, fine finger movements normal  REFLEXES:   deep tendon reflexes present and symmetric  GAIT/STATION:   narrow based gait; able to walk on toes, heels and tandem; romberg is negative    DIAGNOSTIC DATA (LABS, IMAGING, TESTING) - I reviewed patient records, labs, notes, testing and imaging myself where available.  Lab Results  Component Value Date   WBC 6.2 01/08/2014   HGB 15.3 01/08/2014    HCT 44.0 01/08/2014   MCV 81.3 01/08/2014   PLT 249 01/08/2014      Component Value Date/Time   NA 138 01/08/2014 1547   K 4.0 01/08/2014 1547   CL 104 01/08/2014 1547   CO2 27 01/08/2014 1547   GLUCOSE 102* 01/08/2014 1547   BUN 13 01/08/2014 1547   CREATININE 1.23 01/08/2014 1547   CALCIUM 10.1 01/08/2014 1547   PROT 7.1 01/08/2014 1547   ALBUMIN 4.4 01/08/2014 1547   AST 41* 01/08/2014 1547   ALT 26 01/08/2014 1547   ALKPHOS 77 01/08/2014 1547   BILITOT 0.9 01/08/2014 1547   GFRNONAA 82* 01/08/2014 1547   GFRAA >90 01/08/2014 1547   No results found for: CHOL, HDL, LDLCALC, LDLDIRECT, TRIG, CHOLHDL No results found for: ZOXW9U No results found for: VITAMINB12 No results found for: TSH   01/08/14 CT lumbar spine [I reviewed  images myself and agree with interpretation. -VRP]  1. Anterior wedge like compression fractures involving T12 and L1 but no retropulsion, canal compromise or posterior element involvement. 2. No significant intra abdominal/pelvic findings. The bony pelvis is intact.     ASSESSMENT AND PLAN  24 y.o. year old male here with new onset of scintillating scotoma, now with daily low-grade headaches, most consistent with migraine phenomenon. Patient has prior history of migraines age 9 years old. Recent events may have been triggered by overheating and exhaustion. Due to change in symptoms we'll check MRI to rule out secondary causes. Hold off on migraine medication treatment unless specific pattern emerges over the next few weeks/months.  Ddx: migraine aura vs secondary causes (CNS inflamm, vascular, structural)  PLAN: - MRI brain - monitor symptoms  Orders Placed This Encounter  Procedures  . MR Brain W Wo Contrast   Return in about 3 months (around 11/22/2014).    Suanne Marker, MD 08/22/2014, 9:58 AM Certified in Neurology, Neurophysiology and Neuroimaging  [facility] [address], [address] [place], [place]  [address] [phone]

## 2014-09-05 ENCOUNTER — Other Ambulatory Visit: Payer: Managed Care, Other (non HMO)

## 2014-09-19 ENCOUNTER — Ambulatory Visit
Admission: RE | Admit: 2014-09-19 | Discharge: 2014-09-19 | Disposition: A | Discharge status: Home / Self Care | Payer: Managed Care, Other (non HMO) | Source: Ambulatory Visit | Attending: Diagnostic Neuroimaging | Admitting: Diagnostic Neuroimaging

## 2014-09-19 DIAGNOSIS — H539 Unspecified visual disturbance: Secondary | ICD-10-CM

## 2014-09-19 DIAGNOSIS — G43109 Migraine with aura, not intractable, without status migrainosus: Secondary | ICD-10-CM | POA: Diagnosis not present

## 2014-09-19 MED ORDER — GADOBENATE DIMEGLUMINE 529 MG/ML IV SOLN
15.0000 mL | Freq: Once | INTRAVENOUS | Status: AC | PRN
  Administered 2014-09-19: 15 mL via INTRAVENOUS

## 2014-09-26 ENCOUNTER — Other Ambulatory Visit: Payer: Managed Care, Other (non HMO)

## 2014-09-26 ENCOUNTER — Telehealth: Payer: Self-pay | Admitting: Diagnostic Neuroimaging

## 2014-09-26 NOTE — Telephone Encounter (Signed)
Per Dr Marjory Lies, spoke with  Patient and informed him his MRI results are normal. He verbalized understanding, appreciation.

## 2014-09-26 NOTE — Telephone Encounter (Signed)
Patient is calling to get the results of his MRI results.  Please call.

## 2014-09-26 NOTE — Telephone Encounter (Signed)
Yes, pls call with results. -VRP

## 2014-11-17 ENCOUNTER — Other Ambulatory Visit: Payer: Self-pay | Admitting: Neurosurgery

## 2014-11-17 DIAGNOSIS — M5126 Other intervertebral disc displacement, lumbar region: Secondary | ICD-10-CM

## 2014-11-18 ENCOUNTER — Ambulatory Visit
Admission: RE | Admit: 2014-11-18 | Discharge: 2014-11-18 | Disposition: A | Discharge status: Home / Self Care | Payer: Managed Care, Other (non HMO) | Source: Ambulatory Visit | Attending: Neurosurgery | Admitting: Neurosurgery

## 2014-11-18 DIAGNOSIS — M5126 Other intervertebral disc displacement, lumbar region: Secondary | ICD-10-CM

## 2014-11-18 MED ORDER — METHYLPREDNISOLONE ACETATE 40 MG/ML INJ SUSP (RADIOLOG
120.0000 mg | Freq: Once | INTRAMUSCULAR | Status: AC
Start: 2014-11-18 — End: 2014-11-18
  Administered 2014-11-18: 120 mg via EPIDURAL

## 2014-11-18 MED ORDER — IOHEXOL 180 MG/ML  SOLN
1.0000 mL | Freq: Once | INTRAMUSCULAR | Status: DC | PRN
  Administered 2014-11-18: 1 mL via EPIDURAL

## 2014-11-18 NOTE — Discharge Instructions (Signed)

## 2014-12-22 ENCOUNTER — Other Ambulatory Visit: Payer: Self-pay | Admitting: Neurosurgery

## 2014-12-22 DIAGNOSIS — M5416 Radiculopathy, lumbar region: Secondary | ICD-10-CM

## 2014-12-30 ENCOUNTER — Ambulatory Visit
Admission: RE | Admit: 2014-12-30 | Discharge: 2014-12-30 | Disposition: A | Discharge status: Home / Self Care | Payer: Managed Care, Other (non HMO) | Source: Ambulatory Visit | Attending: Neurosurgery | Admitting: Neurosurgery

## 2014-12-30 DIAGNOSIS — M5416 Radiculopathy, lumbar region: Secondary | ICD-10-CM

## 2014-12-30 MED ORDER — IOHEXOL 180 MG/ML  SOLN
15.0000 mL | Freq: Once | INTRAMUSCULAR | Status: AC | PRN
  Administered 2014-12-30: 15 mL via INTRATHECAL

## 2014-12-30 NOTE — Discharge Instructions (Signed)

## 2015-01-02 ENCOUNTER — Telehealth: Payer: Self-pay | Admitting: Radiology

## 2015-01-02 NOTE — Telephone Encounter (Signed)
Received call today from patient regarding persistent headache upon ambulation post myelogram on 12/30/14. Patient states he also has intermittent nausea and vomiting along with hip pain and balance issues. He denies fever, chills or redness/edema at puncture site lumbar spine region. Case was discussed with Dr. Deanne CofferHassell. He suggested patient be set up for blood patch on 12/27 at [facility]. Voicemail left for nursing to contact patient on 12/27 to arrange procedure. Patient updated on plans.

## 2015-01-03 ENCOUNTER — Telehealth: Payer: Self-pay | Admitting: Radiology

## 2015-01-03 ENCOUNTER — Ambulatory Visit
Admission: RE | Admit: 2015-01-03 | Discharge: 2015-01-03 | Disposition: A | Discharge status: Home / Self Care | Payer: Managed Care, Other (non HMO) | Source: Ambulatory Visit | Attending: Neurosurgery | Admitting: Neurosurgery

## 2015-01-03 ENCOUNTER — Other Ambulatory Visit: Payer: Self-pay | Admitting: Neurosurgery

## 2015-01-03 DIAGNOSIS — G971 Other reaction to spinal and lumbar puncture: Secondary | ICD-10-CM

## 2015-01-03 MED ORDER — IOHEXOL 180 MG/ML  SOLN
1.0000 mL | Freq: Once | INTRAMUSCULAR | Status: AC | PRN
  Administered 2015-01-03: 1 mL via EPIDURAL

## 2015-01-03 NOTE — Telephone Encounter (Signed)
Pt called over weekend about a positional headache from his myelo on 12/30/14. Called this am to check on him. He is not sure if he still has the headache because he has not been up since yesterday. Told him he needed to move around and let me know if he still has a headache and if he does will call to get order for a blood patch.
